# Patient Record
Sex: Male | Born: 2000 | Hispanic: No | Marital: Single | Smoking: Never smoker
Health system: Southern US, Community
[De-identification: ages and names within clinical notes are randomized; demographics above are authoritative.]

## PROBLEM LIST (undated history)

## (undated) DIAGNOSIS — T7840XA Allergy, unspecified, initial encounter: Secondary | ICD-10-CM

## (undated) DIAGNOSIS — E669 Obesity, unspecified: Secondary | ICD-10-CM

## (undated) DIAGNOSIS — K219 Gastro-esophageal reflux disease without esophagitis: Secondary | ICD-10-CM

## (undated) HISTORY — DX: Gastro-esophageal reflux disease without esophagitis: K21.9

## (undated) HISTORY — DX: Obesity, unspecified: E66.9

## (undated) HISTORY — DX: Allergy, unspecified, initial encounter: T78.40XA

---
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
## 2020-03-26 ENCOUNTER — Other Ambulatory Visit: Payer: Self-pay

## 2020-03-26 ENCOUNTER — Ambulatory Visit: Payer: Medicaid Other

## 2020-07-24 ENCOUNTER — Ambulatory Visit: Payer: Medicaid Other | Admitting: Family Medicine

## 2020-07-31 ENCOUNTER — Ambulatory Visit: Payer: Medicaid Other | Admitting: Family Medicine

## 2020-08-20 ENCOUNTER — Ambulatory Visit: Payer: Medicaid Other | Admitting: Family Medicine

## 2020-08-24 ENCOUNTER — Ambulatory Visit: Payer: Medicaid Other | Admitting: Family Medicine

## 2020-11-06 ENCOUNTER — Ambulatory Visit (INDEPENDENT_AMBULATORY_CARE_PROVIDER_SITE_OTHER): Payer: Medicaid Other | Admitting: Family Medicine

## 2020-11-06 ENCOUNTER — Encounter: Payer: Self-pay | Admitting: Family Medicine

## 2020-11-06 ENCOUNTER — Other Ambulatory Visit: Payer: Self-pay

## 2020-11-06 VITALS — BP 118/62 | HR 72 | Temp 97.9°F | Resp 16 | Ht 67.5 in | Wt 237.0 lb

## 2020-11-06 DIAGNOSIS — Z13220 Encounter for screening for lipoid disorders: Secondary | ICD-10-CM

## 2020-11-06 DIAGNOSIS — Z0001 Encounter for general adult medical examination with abnormal findings: Secondary | ICD-10-CM

## 2020-11-06 DIAGNOSIS — B079 Viral wart, unspecified: Secondary | ICD-10-CM

## 2020-11-06 DIAGNOSIS — Z0000 Encounter for general adult medical examination without abnormal findings: Secondary | ICD-10-CM

## 2020-11-06 MED ORDER — IMIQUIMOD 5 % EX CREA: TOPICAL_CREAM | CUTANEOUS | 3 refills | Status: AC

## 2020-11-06 NOTE — Progress Notes (Signed)
Subjective:    Patient ID: Juan Taylor, male    DOB: 2001/02/08, 20 y.o.   MRN: [id number]  HPI Patient is a very pleasant 20 year old Hispanic gentleman here today for complete physical exam.  He has had warts on his hands for several years.  We have attempted cryotherapy on 2 separate occasions and he is also been applying Compound W however the warts are getting worse.  There is a large wart complex on the dorsal surface of the right third MCP joint.  He has over 10 separate smaller warts on the tips of his fingers on both hands.  He has failed cryotherapy and is requesting a referral to a dermatologist.  Otherwise he is doing well.  He denies any depression.  He denies any cough or fever or chills or shortness of breath or night sweats or chest pain.  He is in school to become a Psychologist, occupational.  He is in a relationship for 1 year with his girlfriend.  He denies any concern about exposure to HIV.  The remainder of his immunizations are up-to-date although he refuses the COVID vaccination Past Medical History:  Diagnosis Date  . Allergy   . Asthma   . GERD (gastroesophageal reflux disease)   . Mild obesity    No past surgical history on file. No current outpatient medications on file prior to visit.   No current facility-administered medications on file prior to visit.    No Known Allergies Social History   Socioeconomic History  . Marital status: Single    Spouse name: Not on file  . Number of children: Not on file  . Years of education: Not on file  . Highest education level: 12th grade  Occupational History  . Not on file  Tobacco Use  . Smoking status: Never Smoker  . Smokeless tobacco: Never Used  Substance and Sexual Activity  . Alcohol use: No  . Drug use: No  . Sexual activity: Never  Other Topics Concern  . Not on file  Social History Narrative  . Not on file   Social Determinants of Health   Financial Resource Strain: Not on file  Food Insecurity: Not  on file  Transportation Needs: Not on file  Physical Activity: Not on file  Stress: Not on file  Social Connections: Not on file  Intimate Partner Violence: Not on file    Family History  Problem Relation Age of Onset  . Diabetes Mother        gestational  . Diabetes Paternal Grandfather       Review of Systems  All other systems reviewed and are negative.      Objective:   Physical Exam Vitals reviewed.  Constitutional:      General: He is not in acute distress.    Appearance: Normal appearance. He is normal weight. He is not ill-appearing, toxic-appearing or diaphoretic.  HENT:     Head: Normocephalic and atraumatic.     Right Ear: Tympanic membrane, ear canal and external ear normal. There is no impacted cerumen.     Left Ear: Tympanic membrane, ear canal and external ear normal. There is no impacted cerumen.     Nose: Nose normal. No congestion or rhinorrhea.     Mouth/Throat:     Mouth: Mucous membranes are moist.     Pharynx: Oropharynx is clear. No oropharyngeal exudate or posterior oropharyngeal erythema.  Eyes:     General: No scleral icterus.       Right  eye: No discharge.        Left eye: No discharge.     Extraocular Movements: Extraocular movements intact.     Conjunctiva/sclera: Conjunctivae normal.     Pupils: Pupils are equal, round, and reactive to light.  Neck:     Vascular: No carotid bruit.  Cardiovascular:     Rate and Rhythm: Normal rate and regular rhythm.     Pulses: Normal pulses.     Heart sounds: Normal heart sounds. No murmur heard. No friction rub. No gallop.   Pulmonary:     Effort: Pulmonary effort is normal. No respiratory distress.     Breath sounds: Normal breath sounds. No stridor. No wheezing, rhonchi or rales.  Chest:     Chest wall: No tenderness.  Abdominal:     General: Bowel sounds are normal. There is no distension.     Palpations: Abdomen is soft. There is no mass.     Tenderness: There is no abdominal tenderness.  There is no right CVA tenderness, left CVA tenderness, guarding or rebound.     Hernia: No hernia is present.  Genitourinary:    Penis: Normal.      Testes: Normal.  Musculoskeletal:     Cervical back: No rigidity. No muscular tenderness.     Right lower leg: No edema.     Left lower leg: No edema.  Lymphadenopathy:     Cervical: No cervical adenopathy.  Skin:    General: Skin is warm.     Coloration: Skin is not jaundiced.     Findings: Lesion present. No rash.  Neurological:     General: No focal deficit present.     Mental Status: He is alert and oriented to person, place, and time.     Cranial Nerves: No cranial nerve deficit.     Sensory: No sensory deficit.     Motor: No weakness.     Coordination: Coordination normal.     Gait: Gait normal.     Deep Tendon Reflexes: Reflexes normal.  Psychiatric:        Mood and Affect: Mood normal.        Behavior: Behavior normal.        Thought Content: Thought content normal.        Judgment: Judgment normal.      More than 10 different viral warts on his fingers of both hands     Assessment & Plan:  General medical exam - Plan: CBC with Differential/Platelet, COMPLETE METABOLIC PANEL WITH GFR, Lipid panel  Screening cholesterol level - Plan: CBC with Differential/Platelet, COMPLETE METABOLIC PANEL WITH GFR, Lipid panel  Viral warts, unspecified type  Will check CBC, CMP, fasting lipid panel.  Offered HIV screening but the patient politely declined.  He denies any alcohol or tobacco use.  He is in a stable monogamous relationship.  Treat the viral warts with Aldara cream every other day.  Meanwhile consult dermatology for possible podofilox treatment as he is tried and failed cryotherapy.  Recommended the COVID-vaccine.

## 2020-11-07 LAB — COMPLETE METABOLIC PANEL WITH GFR
AG Ratio: 1.8 (calc) (ref 1.0–2.5)
ALT: 52 U/L — ABNORMAL HIGH (ref 8–46)
AST: 28 U/L (ref 12–32)
Albumin: 4.4 g/dL (ref 3.6–5.1)
Alkaline phosphatase (APISO): 99 U/L (ref 46–169)
BUN: 19 mg/dL (ref 7–20)
CO2: 27 mmol/L (ref 20–32)
Calcium: 9.5 mg/dL (ref 8.9–10.4)
Chloride: 105 mmol/L (ref 98–110)
Creat: 0.82 mg/dL (ref 0.60–1.26)
GFR, Est African American: 149 mL/min/{1.73_m2} (ref >=60)
GFR, Est Non African American: 128 mL/min/{1.73_m2} (ref >=60)
Globulin: 2.4 g/dL (calc) (ref 2.1–3.5)
Glucose, Bld: 106 mg/dL — ABNORMAL HIGH (ref 65–99)
Potassium: 4.2 mmol/L (ref 3.8–5.1)
Sodium: 140 mmol/L (ref 135–146)
Total Bilirubin: 1 mg/dL (ref 0.2–1.1)
Total Protein: 6.8 g/dL (ref 6.3–8.2)

## 2020-11-07 LAB — CBC WITH DIFFERENTIAL/PLATELET
Absolute Monocytes: 447 cells/uL (ref 200–950)
Basophils Absolute: 52 cells/uL (ref 0–200)
Basophils Relative: 1 %
Eosinophils Absolute: 99 cells/uL (ref 15–500)
Eosinophils Relative: 1.9 %
HCT: 46.3 % (ref 38.5–50.0)
Hemoglobin: 15.6 g/dL (ref 13.2–17.1)
Lymphs Abs: 2179 cells/uL (ref 850–3900)
MCH: 30.1 pg (ref 27.0–33.0)
MCHC: 33.7 g/dL (ref 32.0–36.0)
MCV: 89.2 fL (ref 80.0–100.0)
MPV: 10 fL (ref 7.5–12.5)
Monocytes Relative: 8.6 %
Neutro Abs: 2423 cells/uL (ref 1500–7800)
Neutrophils Relative %: 46.6 %
Platelets: 235 10*3/uL (ref 140–400)
RBC: 5.19 10*6/uL (ref 4.20–5.80)
RDW: 12.3 % (ref 11.0–15.0)
Total Lymphocyte: 41.9 %
WBC: 5.2 10*3/uL (ref 3.8–10.8)

## 2020-11-07 LAB — LIPID PANEL
Cholesterol: 178 mg/dL — ABNORMAL HIGH (ref <170)
HDL: 40 mg/dL — ABNORMAL LOW (ref >45)
LDL Cholesterol (Calc): 117 mg/dL (calc) — ABNORMAL HIGH (ref <110)
Non-HDL Cholesterol (Calc): 138 mg/dL (calc) — ABNORMAL HIGH (ref <120)
Total CHOL/HDL Ratio: 4.5 (calc) (ref <5.0)
Triglycerides: 103 mg/dL — ABNORMAL HIGH (ref <90)

## 2021-04-21 ENCOUNTER — Ambulatory Visit: Payer: Medicaid Other

## 2022-04-05 ENCOUNTER — Ambulatory Visit: Payer: Medicaid Other | Admitting: Allergy and Immunology

## 2022-05-31 ENCOUNTER — Ambulatory Visit: Payer: Medicaid Other | Admitting: Allergy and Immunology
